# Patient Record
Sex: Male | Born: 1950 | Race: White | Hispanic: No | Marital: Married | Smoking: Never smoker
Health system: Southern US, Community
[De-identification: ages and names within clinical notes are randomized; demographics above are authoritative.]

## PROBLEM LIST (undated history)

## (undated) DIAGNOSIS — C61 Malignant neoplasm of prostate: Secondary | ICD-10-CM

## (undated) DIAGNOSIS — E119 Type 2 diabetes mellitus without complications: Secondary | ICD-10-CM

## (undated) DIAGNOSIS — N289 Disorder of kidney and ureter, unspecified: Secondary | ICD-10-CM

## (undated) DIAGNOSIS — N183 Chronic kidney disease, stage 3 unspecified: Secondary | ICD-10-CM

---

## 2007-05-23 ENCOUNTER — Emergency Department (HOSPITAL_COMMUNITY): Admission: EM | Admit: 2007-05-23 | Discharge: 2007-05-23 | Payer: Self-pay | Admitting: Emergency Medicine

## 2018-07-15 ENCOUNTER — Encounter (HOSPITAL_COMMUNITY): Payer: Self-pay | Admitting: Emergency Medicine

## 2018-07-15 ENCOUNTER — Emergency Department (HOSPITAL_COMMUNITY): Payer: Medicare Other

## 2018-07-15 ENCOUNTER — Other Ambulatory Visit: Payer: Self-pay

## 2018-07-15 ENCOUNTER — Emergency Department (HOSPITAL_COMMUNITY)
Admission: EM | Admit: 2018-07-15 | Discharge: 2018-07-15 | Disposition: A | Discharge status: Home / Self Care | Payer: Medicare Other | Attending: Emergency Medicine | Admitting: Emergency Medicine

## 2018-07-15 DIAGNOSIS — Z79899 Other long term (current) drug therapy: Secondary | ICD-10-CM | POA: Insufficient documentation

## 2018-07-15 DIAGNOSIS — E1122 Type 2 diabetes mellitus with diabetic chronic kidney disease: Secondary | ICD-10-CM | POA: Diagnosis not present

## 2018-07-15 DIAGNOSIS — M6281 Muscle weakness (generalized): Secondary | ICD-10-CM | POA: Diagnosis not present

## 2018-07-15 DIAGNOSIS — R531 Weakness: Secondary | ICD-10-CM

## 2018-07-15 DIAGNOSIS — N183 Chronic kidney disease, stage 3 (moderate): Secondary | ICD-10-CM | POA: Insufficient documentation

## 2018-07-15 DIAGNOSIS — Z20828 Contact with and (suspected) exposure to other viral communicable diseases: Secondary | ICD-10-CM | POA: Insufficient documentation

## 2018-07-15 HISTORY — DX: Disorder of kidney and ureter, unspecified: N28.9

## 2018-07-15 HISTORY — DX: Malignant neoplasm of prostate: C61

## 2018-07-15 HISTORY — DX: Chronic kidney disease, stage 3 unspecified: N18.30

## 2018-07-15 HISTORY — DX: Type 2 diabetes mellitus without complications: E11.9

## 2018-07-15 LAB — COMPREHENSIVE METABOLIC PANEL
ALT: 31 U/L (ref 0–44)
AST: 41 U/L (ref 15–41)
Albumin: 3.7 g/dL (ref 3.5–5.0)
Alkaline Phosphatase: 73 U/L (ref 38–126)
Anion gap: 11 (ref 5–15)
BUN: 19 mg/dL (ref 8–23)
CO2: 22 mmol/L (ref 22–32)
Calcium: 8.7 mg/dL — ABNORMAL LOW (ref 8.9–10.3)
Chloride: 106 mmol/L (ref 98–111)
Creatinine, Ser: 1.29 mg/dL — ABNORMAL HIGH (ref 0.61–1.24)
GFR calc Af Amer: >60 mL/min (ref >60)
GFR calc non Af Amer: 56 mL/min — ABNORMAL LOW (ref >60)
Glucose, Bld: 140 mg/dL — ABNORMAL HIGH (ref 70–99)
Potassium: 4.1 mmol/L (ref 3.5–5.1)
Sodium: 139 mmol/L (ref 135–145)
Total Bilirubin: 1.3 mg/dL — ABNORMAL HIGH (ref 0.3–1.2)
Total Protein: 7.6 g/dL (ref 6.5–8.1)

## 2018-07-15 LAB — CBC WITH DIFFERENTIAL/PLATELET
Abs Immature Granulocytes: 0.01 10*3/uL (ref 0.00–0.07)
Basophils Absolute: 0 10*3/uL (ref 0.0–0.1)
Basophils Relative: 0 %
Eosinophils Absolute: 0.1 10*3/uL (ref 0.0–0.5)
Eosinophils Relative: 3 %
HCT: 45 % (ref 39.0–52.0)
Hemoglobin: 14.6 g/dL (ref 13.0–17.0)
Immature Granulocytes: 0 %
Lymphocytes Relative: 11 %
Lymphs Abs: 0.6 10*3/uL — ABNORMAL LOW (ref 0.7–4.0)
MCH: 30.4 pg (ref 26.0–34.0)
MCHC: 32.4 g/dL (ref 30.0–36.0)
MCV: 93.6 fL (ref 80.0–100.0)
Monocytes Absolute: 0.3 10*3/uL (ref 0.1–1.0)
Monocytes Relative: 7 %
Neutro Abs: 4 10*3/uL (ref 1.7–7.7)
Neutrophils Relative %: 79 %
Platelets: 124 10*3/uL — ABNORMAL LOW (ref 150–400)
RBC: 4.81 MIL/uL (ref 4.22–5.81)
RDW: 12.7 % (ref 11.5–15.5)
WBC: 5.1 10*3/uL (ref 4.0–10.5)
nRBC: 0 % (ref 0.0–0.2)

## 2018-07-15 LAB — URINALYSIS, ROUTINE W REFLEX MICROSCOPIC
Bilirubin Urine: NEGATIVE
Glucose, UA: NEGATIVE mg/dL
Hgb urine dipstick: NEGATIVE
Ketones, ur: NEGATIVE mg/dL
Leukocytes,Ua: NEGATIVE
Nitrite: NEGATIVE
Protein, ur: NEGATIVE mg/dL
Specific Gravity, Urine: 1.017 (ref 1.005–1.030)
pH: 5 (ref 5.0–8.0)

## 2018-07-15 LAB — TROPONIN I: Troponin I: <0.03 ng/mL (ref <0.03)

## 2018-07-15 LAB — CBG MONITORING, ED: Glucose-Capillary: 120 mg/dL — ABNORMAL HIGH (ref 70–99)

## 2018-07-15 NOTE — Discharge Instructions (Addendum)
1. Medications: usual home medications 2. Treatment: rest, drink plenty of fluids,  3. Follow Up: Please followup with your primary doctor in 2-3 days for discussion of your diagnoses and further evaluation after today's visit; if you do not have a primary care doctor use the resource guide provided to find one; Please return to the ER for worsening symptoms, falls, loss of consciousness or other concerns

## 2018-07-15 NOTE — ED Provider Notes (Signed)
Medical screening examination/treatment/procedure(s) were conducted as a shared visit with non-physician practitioner(s) and myself.  I personally evaluated the patient during the encounter.  Clinical Impression:   Final diagnoses:  Generalized weakness   The patient is a 68 year old male, presenting with a complaint of generalized weakness.  The patient's history is fairly benign except for the a progressive weakness throughout the day, seems to be better when he wakes up and worse as the day goes on.  He does have some difficulty with vision as the day goes on stating it goes blurry but not double.  On exam the patient does not appear acutely ill, his vital signs are very normal, his lab work EKG and chest x-ray are all normal and neurologically the patient is able to perform all of the tasks that I asked.  He is speech is clear, coordination is normal, he has a mild generalized fatigue and weakness.  Overall the patient does not have any reason for admission or aggressive testing, he can be sent outpatient to neurology for further work-up of possible myasthenia gravis or other muscular / neurological weakness disorder.  The patient is agreeable with this.   EKG Interpretation  Date/Time:  Saturday July 15 2018 12:42:13 EDT Ventricular Rate:  73 PR Interval:    QRS Duration: 143 QT Interval:  420 QTC Calculation: 463 R Axis:   73 Text Interpretation:  Sinus rhythm Right bundle branch block unchanged Confirmed by Noemi Chapel [phone]) on 07/15/2018 2:50:59 PM          Noemi Chapel, MD 07/16/18 [phone]

## 2018-07-15 NOTE — ED Notes (Signed)
Dr M in to assess  

## 2018-07-15 NOTE — ED Provider Notes (Signed)
[facility] EMERGENCY DEPARTMENT Provider Note   CSN: [id number] Arrival date & time: 07/15/18  1214    History   Chief Complaint Chief Complaint  Patient presents with  . Weakness    HPI Charles Walker is a 68 y.o. male with a hx of NIDDM, CKD, melanoma, prostate cancer presents to the Emergency Department complaining of gradual, persistent, progressively worsening generalized weakness x 1 week.  He describes this as simply being out of energy.  He states it worsens throughout the day.  Pt reports no focal weakness, but does reports his legs feel heavy.  Denies difficulty walking, dizziness, feeling off balance, syncope, near syncope, diplopia, numbness, tingling, falls.  Pt does report he has had some blurred vision over the last several weeks.  Pt reports he lives at home with his wife.  No sick contacts.  Denies extended periods of time outside.  Pt reports he was evaluated at [facility] 5 days ago for the same and was Dx with dehydration. Pt reports drinking Gatorade since that time without improvement.  Pt reports he is otherwise eating, drinking and urinating normally.  Pt denies fever, chills, headache, neck pain, neck, chest pain, shortness of breath, dyspnea on exertion, abdominal pain, nausea, vomiting, diarrhea, dysuria, urinary frequency, urinary urgency, hematuria.  Nothing seems to make his symptoms better or worse.     The history is provided by the patient and medical records. No language interpreter was used.    Past Medical History:  Diagnosis Date  . Diabetes mellitus without complication ([place])   . Prostate CA ([place])   . Renal disorder   . Stage 3 chronic kidney disease ([facility])     There are no active problems to display for this patient.   Past Surgical History:  Procedure Laterality Date  . kidney stones          Home Medications    Prior to Admission medications   Medication Sig Start Date End Date Taking? Authorizing Provider  Ascorbic Acid (VITAMIN  C) 100 MG tablet Take 100 mg by mouth daily.    Yes [provider]  atorvastatin (LIPITOR) 40 MG tablet Take 40 mg by mouth daily at 6 PM.  05/16/18  Yes [provider]  Boswellia-Glucosamine-Vit D (OSTEO BI-FLEX ONE PER DAY PO) Take 1 tablet by mouth daily.   Yes [provider]  Cholecalciferol (VITAMIN D-1000 MAX ST) 25 MCG (1000 UT) tablet Take 1,000 Units by mouth daily.  01/19/12  Yes [provider]  Cyanocobalamin (VITAMIN B12 PO) Take 1 tablet by mouth daily.    Yes [provider]  Ginkgo Biloba 120 MG TABS Take 2 tablets by mouth daily with supper.   Yes [provider]  thiamine (VITAMIN B-1) 100 MG tablet Take 100 mg by mouth daily.    Yes [provider]    Family History Family History  Problem Relation Age of Onset  . Heart attack Father   . Heart attack Brother     Social History Social History   Tobacco Use  . Smoking status: Never Smoker  . Smokeless tobacco: Never Used  Substance Use Topics  . Alcohol use: Never    Frequency: Never  . Drug use: Never     Allergies   Chlorhexidine and Penicillins   Review of Systems Review of Systems  Constitutional: Positive for fatigue. Negative for appetite change, diaphoresis, fever and unexpected weight change.  HENT: Negative for mouth sores.   Eyes: Negative for visual  disturbance.  Respiratory: Negative for cough, chest tightness, shortness of breath and wheezing.   Cardiovascular: Negative for chest pain.  Gastrointestinal: Negative for abdominal pain, constipation, diarrhea, nausea and vomiting.  Endocrine: Negative for polydipsia, polyphagia and polyuria.  Genitourinary: Negative for dysuria, frequency, hematuria and urgency.  Musculoskeletal: Negative for back pain and neck stiffness.  Skin: Negative for rash.  Allergic/Immunologic: Negative for immunocompromised state.  Neurological: Positive for weakness. Negative for syncope,  light-headedness and headaches.  Hematological: Does not bruise/bleed easily.  Psychiatric/Behavioral: Negative for sleep disturbance. The patient is not nervous/anxious.      Physical Exam Updated Vital Signs BP 135/84 (BP Location: Right Arm)   Pulse 78   Temp 98.1 F (36.7 C) (Oral)   Resp 15   SpO2 97%   Physical Exam Vitals signs and nursing note reviewed.  Constitutional:      General: He is not in acute distress.    Appearance: He is well-developed. He is not diaphoretic.     Comments: Awake, alert, nontoxic appearance  HENT:     Head: Normocephalic and atraumatic.     Nose: Nose normal.     Mouth/Throat:     Mouth: Mucous membranes are moist.     Pharynx: No oropharyngeal exudate.  Eyes:     General: No scleral icterus.    Extraocular Movements: Extraocular movements intact.     Conjunctiva/sclera: Conjunctivae normal.     Pupils: Pupils are equal, round, and reactive to light.  Neck:     Musculoskeletal: Normal range of motion and neck supple.  Cardiovascular:     Rate and Rhythm: Normal rate and regular rhythm.  Pulmonary:     Effort: Pulmonary effort is normal. No respiratory distress.  Abdominal:     Palpations: Abdomen is soft. There is no mass.     Tenderness: There is no abdominal tenderness. There is no guarding or rebound.  Musculoskeletal: Normal range of motion.     Right lower leg: Edema (trace, nonpitting) present.     Left lower leg: Edema (trace, nonpitting) present.  Skin:    General: Skin is warm and dry.     Capillary Refill: Capillary refill takes less than 2 seconds.  Neurological:     General: No focal deficit present.     Mental Status: He is alert and oriented to person, place, and time.     Comments: Mental Status:  Alert, oriented, thought content appropriate, able to give a coherent history. Speech fluent without evidence of aphasia. Able to follow 2 step commands without difficulty.  Cranial Nerves:  II:  Peripheral visual fields  grossly normal, pupils equal, round, reactive to light III,IV, VI: ptosis not present, extra-ocular motions intact bilaterally  V,VII: smile symmetric, facial light touch sensation equal VIII: hearing grossly normal to voice  X: uvula elevates symmetrically  XI: bilateral shoulder shrug symmetric and strong XII: midline tongue extension without fassiculations Motor:  Normal tone. 5/5 in upper and lower extremities bilaterally including strong and equal grip strength and dorsiflexion/plantar flexion Sensory: light touch normal in all extremities.  Cerebellar: normal finger-to-nose with bilateral upper extremities Gait: slow gait and normal balance CV: distal pulses palpable throughout   Psychiatric:        Mood and Affect: Mood normal.      ED Treatments / Results  Labs (all labs ordered are listed, but only abnormal results are displayed) Labs Reviewed  CBC WITH DIFFERENTIAL/PLATELET - Abnormal; Notable for the following components:  Result Value   Platelets 124 (*)    Lymphs Abs 0.6 (*)    All other components within normal limits  COMPREHENSIVE METABOLIC PANEL - Abnormal; Notable for the following components:   Glucose, Bld 140 (*)    Creatinine, Ser 1.29 (*)    Calcium 8.7 (*)    Total Bilirubin 1.3 (*)    GFR calc non Af Amer 56 (*)    All other components within normal limits  CBG MONITORING, ED - Abnormal; Notable for the following components:   Glucose-Capillary 120 (*)    All other components within normal limits  NOVEL CORONAVIRUS, NAA (HOSPITAL ORDER, SEND-OUT TO REF LAB)  TROPONIN I  URINALYSIS, ROUTINE W REFLEX MICROSCOPIC    EKG EKG Interpretation  Date/Time:  Saturday July 15 2018 12:30:02 EDT Ventricular Rate:  76 PR Interval:    QRS Duration: 143 QT Interval:  406 QTC Calculation: 457 R Axis:   77 Text Interpretation:  Sinus rhythm Right bundle branch block No old tracing to compare Confirmed by Noemi Chapel [phone]) on 07/15/2018 12:36:00 PM    Radiology Dg Chest 2 View  Result Date: 07/15/2018 CLINICAL DATA:  Weakness for 1 week. EXAM: CHEST - 2 VIEW COMPARISON:  07/10/2018 chest radiograph FINDINGS: The lungs appear clear.  Cardiac and mediastinal contours normal. No pleural effusion identified. Thoracic spondylosis. IMPRESSION: 1. No active cardiopulmonary disease is radiographically apparent. A cause for the patient's weakness is not identified. Electronically Signed   By: Van Clines M.D.   On: 07/15/2018 13:41    Procedures Procedures (including critical care time)  Medications Ordered in ED Medications - No data to display   Initial Impression / Assessment and Plan / ED Course  I have reviewed the triage vital signs and the nursing notes.  Pertinent labs & imaging results that were available during my care of the patient were reviewed by me and considered in my medical decision making (see chart for details).  Clinical Course as of Jul 14 1501  Sat Jul 15, 2018  1336 No signs of dehydration or UTI  Ketones, ur: NEGATIVE [HM]  1336 No anemia  Hemoglobin: 14.6 [HM]  1336 Afebrile.   Temp: 98.1 F (36.7 C) [HM]  1337 No tachycardia  Pulse Rate: 78 [HM]  1337 No hypoxia  SpO2: 97 % [HM]  1408 Baseline 1.4-1.5 at [facility] per Care Everywhere  Creatinine(!): 1.29 [HM]  1409 baseline  Glucose(!): 140 [HM]  1409 Baseline 8.9 at [facility] per care everywhere  Calcium(!): 8.7 [HM]  1409 WNL.  Pt without chest pain and ECG nonischemic.  Troponin I: <0.03 [HM]  1456 The patient was discussed with and seen by Dr. Sabra Heck who agrees with the treatment plan.   [HM]    Clinical Course User Index [HM] Victormanuel Mclure, Jarrett Soho, PA-C       Pt presents with generalized and progressive weakness.  Labs and x-rays are reassuring today.  No evidence of COVID.  Pt without focal neuro findings here in the ED.  Vitals are stable and within normal limits. No signs of dehydration.  Concern for possible Myasthenia Gravis.  Pt does not  carry this as a diagnoses.  He will have outpatient neuro follow-up for this.  Pt given referral, but may also contact his PCP for referral into the [facility] system.  Also discussed reasons to return to the emergency department.  Pt states understanding and is agreement with the plan.    Final Clinical Impressions(s) / ED Diagnoses  Final diagnoses:  Generalized weakness    ED Discharge Orders    None       Maggie Dworkin, Gwenlyn Perking 07/15/18 1503    Noemi Chapel, MD 07/16/18 [phone]

## 2018-07-15 NOTE — ED Triage Notes (Signed)
Patient reports increasing weakness x 1 week. Denies fever or cough. Patient states he was seen at another facility on Tue night, dx with dehydration. Patient states he has been drinking fluids and doesn't think it is dehydration. Denies n/v/d.

## 2018-07-15 NOTE — ED Notes (Signed)
Pt seen at [facility] on Tuesday   Told dehydrated   He continues to feel weak   Pt with 4 plus pedal edema, CKD,  Physician is in [place]  PA has evaled prior to triage

## 2018-07-15 NOTE — ED Notes (Signed)
From Rad 

## 2018-07-16 LAB — NOVEL CORONAVIRUS, NAA (HOSP ORDER, SEND-OUT TO REF LAB; TAT 18-24 HRS): SARS-CoV-2, NAA: NOT DETECTED

## 2018-07-17 ENCOUNTER — Telehealth (HOSPITAL_COMMUNITY): Payer: Self-pay

## 2020-12-18 IMAGING — DX CHEST - 2 VIEW
2 series · 2 of 2 positions shown · non-contrast
Comparison: 07/10/2018 chest radiograph

CLINICAL DATA: Weakness for 1 week.

EXAM:
CHEST - 2 VIEW

[chest lat]
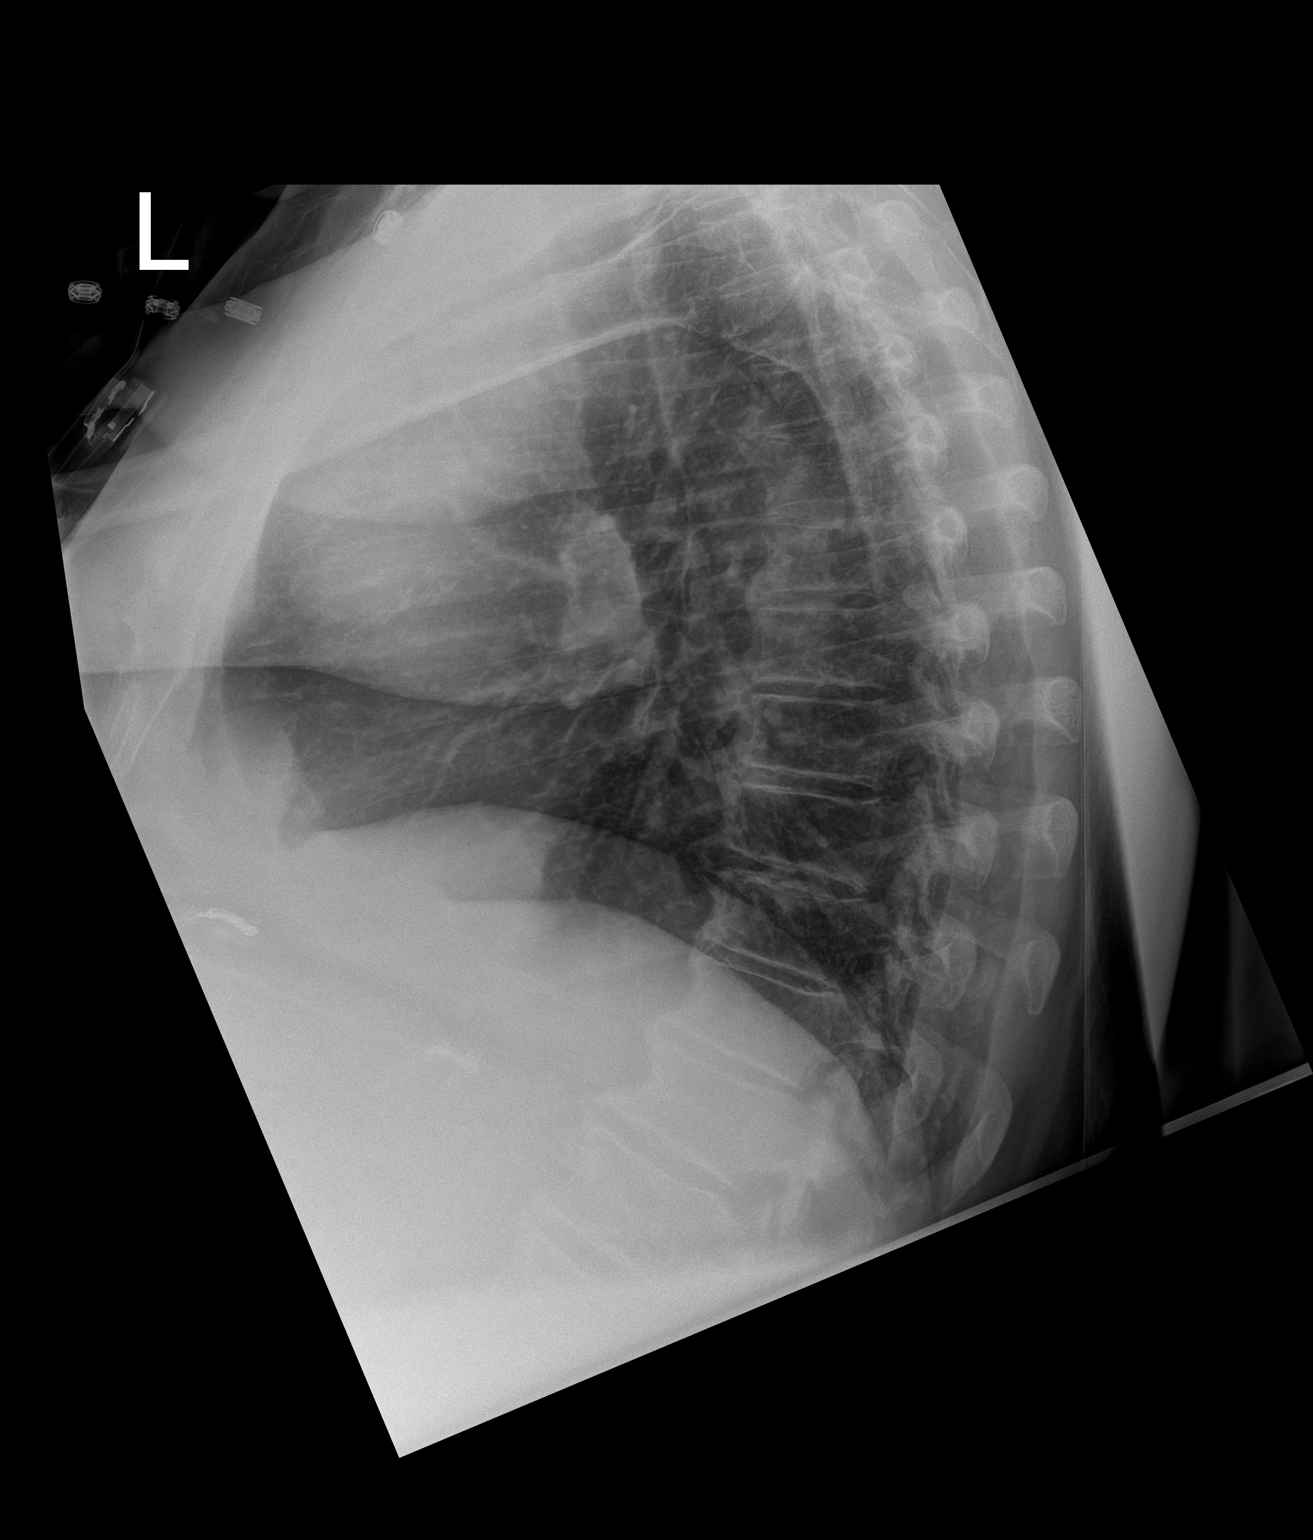

[chest ap]
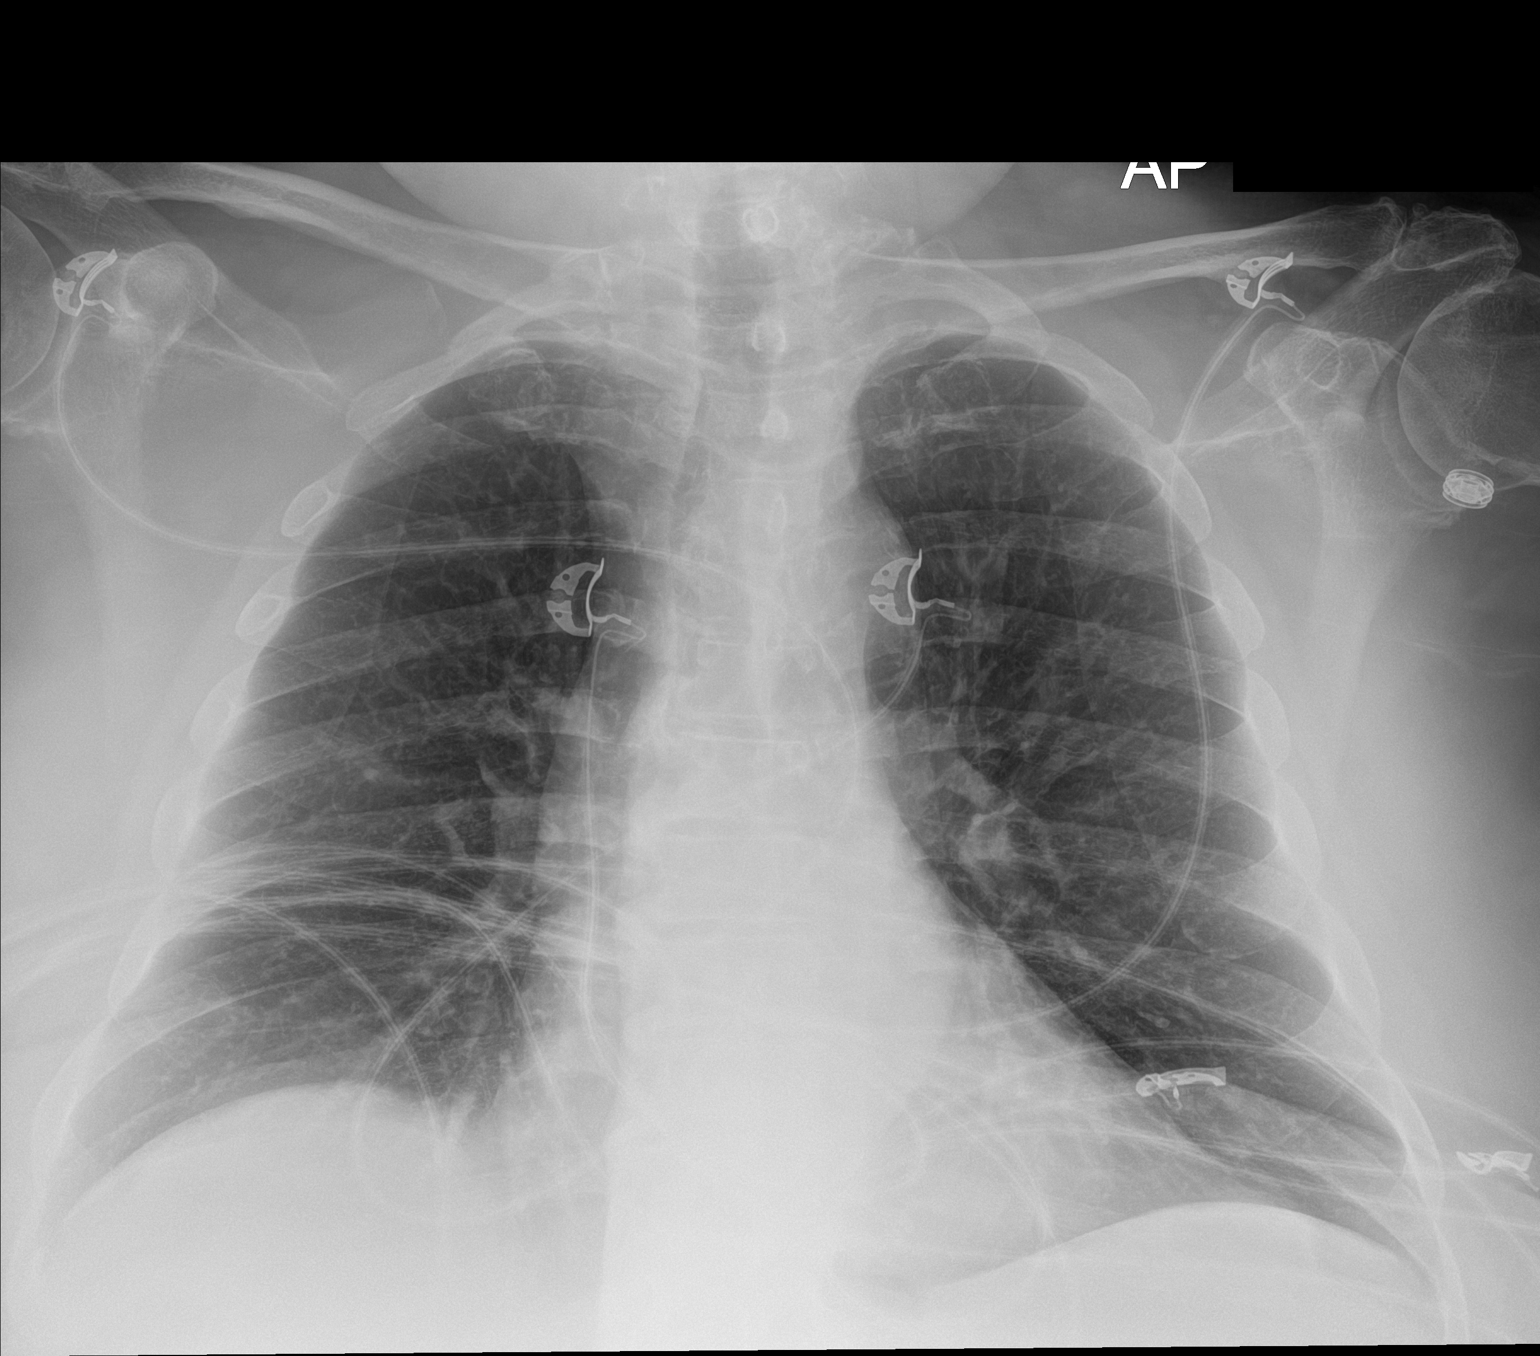

[2 of 2 positions shown; findings below may reference images not displayed]

FINDINGS: The lungs appear clear.  Cardiac and mediastinal contours normal.

No pleural effusion identified.

Thoracic spondylosis.
IMPRESSION: 1. No active cardiopulmonary disease is radiographically apparent. A
cause for the patient's weakness is not identified.

## 2022-02-01 DEATH — deceased
